# Patient Record
Sex: Male | Born: 1980 | Race: White | Hispanic: No | Marital: Single | Smoking: Current every day smoker
Health system: Southern US, Community
[De-identification: ages and names within clinical notes are randomized; demographics above are authoritative.]

---

## 2004-05-25 ENCOUNTER — Emergency Department (HOSPITAL_COMMUNITY): Admission: EM | Admit: 2004-05-25 | Discharge: 2004-05-25 | Payer: Self-pay | Admitting: Emergency Medicine

## 2009-03-30 ENCOUNTER — Ambulatory Visit: Payer: Self-pay | Admitting: Internal Medicine

## 2009-03-30 ENCOUNTER — Encounter: Payer: Self-pay | Admitting: Internal Medicine

## 2009-03-30 ENCOUNTER — Ambulatory Visit (HOSPITAL_COMMUNITY): Admission: RE | Admit: 2009-03-30 | Discharge: 2009-03-30 | Payer: Self-pay | Admitting: Internal Medicine

## 2009-03-30 DIAGNOSIS — K089 Disorder of teeth and supporting structures, unspecified: Secondary | ICD-10-CM | POA: Insufficient documentation

## 2009-03-30 LAB — CONVERTED CEMR LAB
Basophils Absolute: 0 10*3/uL (ref 0.0–0.1)
CO2: 26 meq/L (ref 19–32)
Chloride: 106 meq/L (ref 96–112)
Eosinophils Absolute: 0.1 10*3/uL (ref 0.0–0.7)
Eosinophils Relative: 3 % (ref 0–5)
Lymphocytes Relative: 30 % (ref 12–46)
Lymphs Abs: 1.2 10*3/uL (ref 0.7–4.0)
Monocytes Absolute: 0.4 10*3/uL (ref 0.1–1.0)
Neutro Abs: 2.2 10*3/uL (ref 1.7–7.7)
Platelets: 166 10*3/uL (ref 150–400)
RDW: 12.4 % (ref 11.5–15.5)
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
## 2012-09-28 ENCOUNTER — Emergency Department (HOSPITAL_COMMUNITY)
Admission: EM | Admit: 2012-09-28 | Discharge: 2012-09-28 | Disposition: A | Discharge status: Home / Self Care | Payer: Self-pay | Attending: Emergency Medicine | Admitting: Emergency Medicine

## 2012-09-28 ENCOUNTER — Encounter (HOSPITAL_COMMUNITY): Payer: Self-pay | Admitting: Emergency Medicine

## 2012-09-28 DIAGNOSIS — F17200 Nicotine dependence, unspecified, uncomplicated: Secondary | ICD-10-CM | POA: Insufficient documentation

## 2012-09-28 DIAGNOSIS — L049 Acute lymphadenitis, unspecified: Secondary | ICD-10-CM | POA: Insufficient documentation

## 2012-09-28 DIAGNOSIS — Z792 Long term (current) use of antibiotics: Secondary | ICD-10-CM | POA: Insufficient documentation

## 2012-09-28 MED ORDER — HYDROCODONE-ACETAMINOPHEN 5-325 MG PO TABS: 2.0000 | ORAL_TABLET | ORAL | Status: DC | PRN

## 2012-09-28 MED ORDER — AMOXICILLIN 500 MG PO CAPS: 500.0000 mg | ORAL_CAPSULE | Freq: Three times a day (TID) | ORAL | Status: DC

## 2012-09-28 MED ORDER — HYDROCODONE-ACETAMINOPHEN 5-325 MG PO TABS: 2.0000 | ORAL_TABLET | Freq: Once | ORAL | Status: DC

## 2012-09-28 NOTE — ED Provider Notes (Signed)
History   This chart was scribed for non-physician practitioner working with Carleene Cooper III, MD by Frederik Pear, ED Scribe. This patient was seen in room [facility] and the patient's care was started at 2143.   CSN: [id number]  Arrival date & time 09/28/12  1927   First MD Initiated Contact with Patient 09/28/12 2143      Chief Complaint  Patient presents with  . Mass    (Consider location/radiation/quality/duration/timing/severity/associated sxs/prior treatment) The history is provided by the patient and medical records.    Garrick Midgley is a 32 y.o. male who presents to the Emergency Department complaining of gradually worsening, constant, moderate painful mass to the left axillary area that is aggravated by moving his arm and improved by nothing that began 1 week ago. He denies any fever, chills, nausea, emesis, night sweats, unexplained weight loss, or diarrhea. He reports that he has no chronic medical conditions that require daily medications. He denies any other rashes or masses.   History reviewed. No pertinent past medical history.  History reviewed. No pertinent past surgical history.  No family history on file.  History  Substance Use Topics  . Smoking status: Current Every Day Smoker  . Smokeless tobacco: Not on file  . Alcohol Use: Yes      Review of Systems  Constitutional: Negative for fever, diaphoresis, appetite change, fatigue and unexpected weight change.  HENT: Negative for mouth sores and neck stiffness.   Eyes: Negative for visual disturbance.  Respiratory: Negative for cough, shortness of breath and wheezing.   Cardiovascular: Negative for chest pain.  Gastrointestinal: Negative for nausea, vomiting, abdominal pain, diarrhea and constipation.  Genitourinary: Negative for dysuria, urgency, frequency and hematuria.  Musculoskeletal: Negative for back pain.  Skin: Negative for rash.       Mass  Neurological: Negative for syncope, light-headedness  and headaches.  Hematological: Does not bruise/bleed easily.  Psychiatric/Behavioral: Negative for sleep disturbance. The patient is not nervous/anxious.   All other systems reviewed and are negative.    Allergies  Review of patient's allergies indicates no known allergies.  Home Medications   Current Outpatient Rx  Name  Route  Sig  Dispense  Refill  . IBUPROFEN 200 MG PO TABS   Oral   Take 600 mg by mouth every 6 (six) hours as needed. For pain         . AMOXICILLIN 500 MG PO CAPS   Oral   Take 1 capsule (500 mg total) by mouth 3 (three) times daily.   21 capsule   0   . HYDROCODONE-ACETAMINOPHEN 5-325 MG PO TABS   Oral   Take 2 tablets by mouth every 4 (four) hours as needed for pain.   10 tablet   0     BP 127/76  Pulse 94  Temp 98.1 F (36.7 C) (Oral)  Resp 18  SpO2 100%  Physical Exam  Nursing note and vitals reviewed. Constitutional: He is oriented to person, place, and time. He appears well-developed and well-nourished. No distress.  HENT:  Head: Normocephalic and atraumatic.  Right Ear: Tympanic membrane, external ear and ear canal normal.  Left Ear: Tympanic membrane, external ear and ear canal normal.  Nose: Nose normal. Right sinus exhibits no maxillary sinus tenderness and no frontal sinus tenderness. Left sinus exhibits no maxillary sinus tenderness and no frontal sinus tenderness.  Mouth/Throat: Uvula is midline and mucous membranes are normal. Mucous membranes are not cyanotic. No oral lesions. No dental abscesses or uvula swelling. No  oropharyngeal exudate, posterior oropharyngeal edema, posterior oropharyngeal erythema or tonsillar abscesses.  Eyes: Conjunctivae normal and EOM are normal. Pupils are equal, round, and reactive to light. No scleral icterus.  Neck: Normal range of motion and full passive range of motion without pain. Neck supple. No spinous process tenderness and no muscular tenderness present. Normal range of motion present.        He has a palpable, non-tender, discrete, and mobile sebaceous cyst on the L neck  Cardiovascular: Normal rate, regular rhythm, normal heart sounds and intact distal pulses.  Exam reveals no gallop and no friction rub.   No murmur heard. Pulmonary/Chest: Effort normal and breath sounds normal. No respiratory distress. He has no wheezes. He has no rales. He exhibits no tenderness.  Abdominal: Soft. Bowel sounds are normal. He exhibits no distension and no mass. There is no tenderness. There is no rebound and no guarding.  Musculoskeletal: Normal range of motion. He exhibits no edema and no tenderness.  Lymphadenopathy:       Head (right side): No submental, no submandibular, no tonsillar, no preauricular, no posterior auricular and no occipital adenopathy present.       Head (left side): No submental, no submandibular, no tonsillar, no preauricular, no posterior auricular and no occipital adenopathy present.    He has no cervical adenopathy.       Right cervical: No superficial cervical, no deep cervical and no posterior cervical adenopathy present.      Left cervical: No superficial cervical, no deep cervical and no posterior cervical adenopathy present.    He has axillary adenopathy.       Right axillary: No pectoral and no lateral adenopathy present.       Left axillary: Pectoral adenopathy present. No lateral adenopathy present.      Right: No supraclavicular adenopathy present.       Left: No supraclavicular adenopathy present.  Neurological: He is alert and oriented to person, place, and time. He exhibits normal muscle tone. Coordination normal.       Speech is clear and goal oriented Moves extremities without ataxia  Skin: Skin is warm and dry. No rash noted. He is not diaphoretic. No erythema.       On the left axilla, he has a palpable, tender, discrete, non-mobile mass. There is no erythema, no fluctuance, no induration, and no evidence of cellulitis.  Psychiatric: He has a normal mood  and affect.    ED Course  Procedures (including critical care time)  DIAGNOSTIC STUDIES: Oxygen Saturation is 100% on room air, normal by my interpretation.    COORDINATION OF CARE:  21:48- Discussed planned course of treatment with the patient, including antibiotics and pain medication, who is agreeable at this time.   Labs Reviewed - No data to display No results found.   1. Lymphadenitis, acute       MDM  Carleene Mains presents with swollen lymph node in the left axilla.  No evidence of abscess or cellulitis.  Concern for infectious etiology versus lymphoma.  We'll treat with antibiotics and pain medications. I have given strict instructions for the patient followup here in the emergency department if it is not improved in one week.  Patient alert, oriented, NAD, nontoxic, nonseptic appearing.  He is without B-cell symptoms.  I have also discussed reasons to return immediately to the ER.  Patient expresses understanding and agrees with plan.  1. Medications: Amoxicillin, Vicodin, usual home medications  2. Treatment: rest, drink plenty of fluids, take  your medications as prescribed  3. Follow Up: Please followup with your primary doctor for discussion of your diagnoses and further evaluation after today's visit; if you do not have a primary care doctor use the resource guide provided to find one; followup back up in the emergency department or with another physician if this does not resolve within one week    I personally performed the services described in this documentation, which was scribed in my presence. The recorded information has been reviewed and is accurate.         Dahlia Client Shadara Lopez, PA-C 09/28/12 2248

## 2012-09-28 NOTE — ED Notes (Signed)
C/o painful lump at L axilla since last week.

## 2012-09-29 NOTE — ED Provider Notes (Signed)
Medical screening examination/treatment/procedure(s) were conducted as a shared visit with non-physician practitioner(s) and myself.  I personally evaluated the patient during the encounter Pt with week long Hx of tender left axillary mass.  Exam shows 1 cm tender mass in left axilla, probably lymphadenitis.  Advised antibiotic Rx.  If not improved in a week, pt to return for referral for biopsy.  Carleene Cooper III, MD 09/29/12 1124

## 2013-02-05 ENCOUNTER — Encounter (HOSPITAL_COMMUNITY): Payer: Self-pay | Admitting: Emergency Medicine

## 2013-02-05 ENCOUNTER — Emergency Department (HOSPITAL_COMMUNITY)
Admission: EM | Admit: 2013-02-05 | Discharge: 2013-02-05 | Disposition: A | Discharge status: Home / Self Care | Payer: Self-pay | Attending: Emergency Medicine | Admitting: Emergency Medicine

## 2013-02-05 ENCOUNTER — Emergency Department (HOSPITAL_COMMUNITY): Payer: Self-pay

## 2013-02-05 DIAGNOSIS — M25519 Pain in unspecified shoulder: Secondary | ICD-10-CM | POA: Insufficient documentation

## 2013-02-05 DIAGNOSIS — M25511 Pain in right shoulder: Secondary | ICD-10-CM

## 2013-02-05 DIAGNOSIS — F17200 Nicotine dependence, unspecified, uncomplicated: Secondary | ICD-10-CM | POA: Insufficient documentation

## 2013-02-05 MED ORDER — IBUPROFEN 800 MG PO TABS: 800.0000 mg | ORAL_TABLET | Freq: Three times a day (TID) | ORAL | Status: DC

## 2013-02-05 MED ORDER — HYDROCODONE-ACETAMINOPHEN 5-325 MG PO TABS: 2.0000 | ORAL_TABLET | Freq: Four times a day (QID) | ORAL | Status: DC | PRN

## 2013-02-05 NOTE — ED Provider Notes (Signed)
History    This chart was scribed for Roxy Horseman PA-C, a non-physician practitioner working with Gwyneth Sprout, MD by Lewanda Rife, ED Scribe. This patient was seen in room WTR5/WTR5 and the patient's care was started at 1620.     CSN: [id number]  Arrival date & time 02/05/13  1445   First MD Initiated Contact with Patient 02/05/13 1612      Chief Complaint  Patient presents with  . Shoulder Pain    (Consider location/radiation/quality/duration/timing/severity/associated sxs/prior treatment) The history is provided by the patient.   HPI Comments: Thomas Martinez is a 32 y.o. male who presents to the Emergency Department complaining of constant severe, non-radiating right shoulder pain onset 4 days. Reports unemployed. Describes pain as "being stuck with a needle in shoulder" and 10/10 in severity. Reports pain is aggravated with movement and alleviated by nothing. Denies fever, any injury, nausea, neck pain, back pain, emesis, diarrhea, constipation, and paresthesias. Denies taking any medications for pain to treat pain. Denies injury of right shoulder in the past.  History reviewed. No pertinent past medical history.  No past surgical history on file.  No family history on file.  History  Substance Use Topics  . Smoking status: Current Every Day Smoker  . Smokeless tobacco: Not on file  . Alcohol Use: Yes      Review of Systems  HENT: Negative for neck pain.   Musculoskeletal: Positive for myalgias (right shoulder).  All other systems reviewed and are negative.   A complete 10 system review of systems was obtained and all systems are negative except as noted in the HPI and PMH.    Allergies  Review of patient's allergies indicates no known allergies.  Home Medications   Current Outpatient Rx  Name  Route  Sig  Dispense  Refill  . ibuprofen (ADVIL,MOTRIN) 200 MG tablet   Oral   Take 600 mg by mouth every 6 (six) hours as needed. For pain            BP 125/87  Pulse 107  Temp(Src) 98.3 F (36.8 C) (Oral)  Resp 16  SpO2 99%  Physical Exam  Nursing note and vitals reviewed. Constitutional: He is oriented to person, place, and time. He appears well-developed and well-nourished. No distress.  HENT:  Head: Normocephalic and atraumatic.  Eyes: Conjunctivae and EOM are normal.  Neck: Neck supple. No tracheal deviation present.  Cardiovascular: Normal rate.   Pulmonary/Chest: Effort normal. No respiratory distress.  Musculoskeletal: He exhibits tenderness (right shoulder ).       Right shoulder: He exhibits decreased range of motion (secondary to pain ), tenderness and pain. He exhibits no deformity.  Strength of right shoulder deferred secondary to pain. Hawkin's-Kennedy impingement test negative. Subscapularis strength deficiency.   Neurological: He is alert and oriented to person, place, and time.  Skin: Skin is warm and dry.  Psychiatric: He has a normal mood and affect. His behavior is normal.    ED Course  Procedures (including critical care time) Medications - No data to display   Labs Reviewed - No data to display No results found.   1. Shoulder pain, acute, right       MDM  Patient with right shoulder pain. I am suspicious of rotator cuff injury. Recommend orthopedic followup. Will treat with ibuprofen and some Vicodin. Patient given a sling.     I personally performed the services described in this documentation, which was scribed in my presence. The recorded information has been reviewed  and is accurate.     Roxy Horseman, PA-C 02/06/13 [phone]

## 2013-02-05 NOTE — Progress Notes (Signed)
[facility] has seen patient. Provided with Primary Care Resources.

## 2013-02-05 NOTE — ED Notes (Signed)
Pt c/o atraumatic shoulder pain x 4 days.  Worse upon movement.  Denies injury.

## 2013-02-07 NOTE — ED Provider Notes (Signed)
Medical screening examination/treatment/procedure(s) were performed by non-physician practitioner and as supervising physician I was immediately available for consultation/collaboration.   Gwyneth Sprout, MD 02/07/13 1504

## 2021-07-16 ENCOUNTER — Emergency Department (HOSPITAL_COMMUNITY): Payer: Self-pay

## 2021-07-16 ENCOUNTER — Other Ambulatory Visit: Payer: Self-pay

## 2021-07-16 ENCOUNTER — Emergency Department (HOSPITAL_COMMUNITY)
Admission: EM | Admit: 2021-07-16 | Discharge: 2021-07-17 | Disposition: A | Discharge status: Home / Self Care | Payer: Self-pay | Attending: Emergency Medicine | Admitting: Emergency Medicine

## 2021-07-16 DIAGNOSIS — M546 Pain in thoracic spine: Secondary | ICD-10-CM | POA: Insufficient documentation

## 2021-07-16 DIAGNOSIS — Z79899 Other long term (current) drug therapy: Secondary | ICD-10-CM | POA: Insufficient documentation

## 2021-07-16 DIAGNOSIS — R000 Tachycardia, unspecified: Secondary | ICD-10-CM | POA: Insufficient documentation

## 2021-07-16 DIAGNOSIS — Z20822 Contact with and (suspected) exposure to covid-19: Secondary | ICD-10-CM | POA: Insufficient documentation

## 2021-07-16 DIAGNOSIS — F17200 Nicotine dependence, unspecified, uncomplicated: Secondary | ICD-10-CM | POA: Insufficient documentation

## 2021-07-16 DIAGNOSIS — R1110 Vomiting, unspecified: Secondary | ICD-10-CM | POA: Insufficient documentation

## 2021-07-16 LAB — CBC WITH DIFFERENTIAL/PLATELET
Abs Immature Granulocytes: 0.11 10*3/uL — ABNORMAL HIGH (ref 0.00–0.07)
Basophils Absolute: 0 10*3/uL (ref 0.0–0.1)
Basophils Relative: 0 %
Eosinophils Absolute: 0 10*3/uL (ref 0.0–0.5)
Eosinophils Relative: 0 %
HCT: 53.4 % — ABNORMAL HIGH (ref 39.0–52.0)
Hemoglobin: 18.7 g/dL — ABNORMAL HIGH (ref 13.0–17.0)
Immature Granulocytes: 1 %
Lymphocytes Relative: 12 %
Lymphs Abs: 1.6 10*3/uL (ref 0.7–4.0)
MCH: 31.8 pg (ref 26.0–34.0)
MCHC: 35 g/dL (ref 30.0–36.0)
MCV: 90.8 fL (ref 80.0–100.0)
Monocytes Absolute: 0.5 10*3/uL (ref 0.1–1.0)
Monocytes Relative: 4 %
Neutro Abs: 10.7 10*3/uL — ABNORMAL HIGH (ref 1.7–7.7)
Neutrophils Relative %: 83 %
Platelets: 333 10*3/uL (ref 150–400)
RBC: 5.88 MIL/uL — ABNORMAL HIGH (ref 4.22–5.81)
RDW: 13 % (ref 11.5–15.5)
WBC: 13 10*3/uL — ABNORMAL HIGH (ref 4.0–10.5)
nRBC: 0 % (ref 0.0–0.2)

## 2021-07-16 LAB — COMPREHENSIVE METABOLIC PANEL
ALT: 26 U/L (ref 0–44)
AST: 22 U/L (ref 15–41)
Albumin: 5.6 g/dL — ABNORMAL HIGH (ref 3.5–5.0)
Alkaline Phosphatase: 75 U/L (ref 38–126)
Anion gap: 17 — ABNORMAL HIGH (ref 5–15)
BUN: 22 mg/dL — ABNORMAL HIGH (ref 6–20)
CO2: 24 mmol/L (ref 22–32)
Calcium: 10.2 mg/dL (ref 8.9–10.3)
Chloride: 98 mmol/L (ref 98–111)
Creatinine, Ser: 1.26 mg/dL — ABNORMAL HIGH (ref 0.61–1.24)
GFR, Estimated: >60 mL/min (ref >60)
Glucose, Bld: 131 mg/dL — ABNORMAL HIGH (ref 70–99)
Potassium: 4.1 mmol/L (ref 3.5–5.1)
Sodium: 139 mmol/L (ref 135–145)
Total Bilirubin: 0.4 mg/dL (ref 0.3–1.2)
Total Protein: 9.6 g/dL — ABNORMAL HIGH (ref 6.5–8.1)

## 2021-07-16 LAB — RESP PANEL BY RT-PCR (FLU A&B, COVID) ARPGX2
Influenza A by PCR: NEGATIVE
Influenza B by PCR: NEGATIVE
SARS Coronavirus 2 by RT PCR: NEGATIVE

## 2021-07-16 LAB — TSH: TSH: 0.593 u[IU]/mL (ref 0.350–4.500)

## 2021-07-16 LAB — LIPASE, BLOOD: Lipase: 26 U/L (ref 11–51)

## 2021-07-16 LAB — T4, FREE: Free T4: 0.92 ng/dL (ref 0.61–1.12)

## 2021-07-16 MED ORDER — ONDANSETRON HCL 4 MG/2ML IJ SOLN
4.0000 mg | Freq: Once | INTRAMUSCULAR | Status: AC
  Administered 2021-07-16: 4 mg via INTRAVENOUS
  Filled 2021-07-16: qty 2

## 2021-07-16 MED ORDER — SODIUM CHLORIDE 0.9 % IV BOLUS
1000.0000 mL | Freq: Once | INTRAVENOUS | Status: AC
  Administered 2021-07-16: 1000 mL via INTRAVENOUS

## 2021-07-16 MED ORDER — LACTATED RINGERS IV BOLUS
1000.0000 mL | Freq: Once | INTRAVENOUS | Status: AC
  Administered 2021-07-16: 1000 mL via INTRAVENOUS

## 2021-07-16 MED ORDER — FENTANYL CITRATE PF 50 MCG/ML IJ SOSY
25.0000 ug | PREFILLED_SYRINGE | Freq: Once | INTRAMUSCULAR | Status: AC
  Administered 2021-07-16: 25 ug via INTRAVENOUS
  Filled 2021-07-16: qty 1

## 2021-07-16 MED ORDER — LORAZEPAM 2 MG/ML IJ SOLN
0.5000 mg | Freq: Once | INTRAMUSCULAR | Status: AC
  Administered 2021-07-16: 0.5 mg via INTRAVENOUS
  Filled 2021-07-16: qty 1

## 2021-07-16 MED ORDER — FENTANYL CITRATE PF 50 MCG/ML IJ SOSY
50.0000 ug | PREFILLED_SYRINGE | Freq: Once | INTRAMUSCULAR | Status: AC
  Administered 2021-07-16: 50 ug via INTRAVENOUS
  Filled 2021-07-16: qty 1

## 2021-07-16 NOTE — ED Triage Notes (Signed)
Pt BIB EMS.  Coming from home.  Back pain and vomiting x 2 days.  Pt does endorse use of fentanyl a couple of hours ago.

## 2021-07-16 NOTE — ED Provider Notes (Signed)
[facility]-EMERGENCY DEPT Provider Note   CSN: [id number] Arrival date & time: 07/16/21  1851     History Chief Complaint  Patient presents with   Emesis    Thomas Martinez is a 40 y.o. male.  HPI     40 year old male with history of polysubstance abuse presents with concern for back pain and vomiting for 2 days.  Has had 5-10 episodes of emesis per day. No hematemesis. No diarrhea or constipation. No significant abdominal pain.No chest pain or dyspnea, does feel upper back pain, No fever, no urinary symptoms.  Upper back pain for days.  Has not stopped any medications recently. Occ etoh, not regular/no etoh withdrawal.  Hx of polysubstance abuse. No hx of IVDU,  reports snorting fentanyl prior to arrival.    History reviewed. No pertinent past medical history.  Patient Active Problem List   Diagnosis Date Noted   DENTAL PAIN 03/30/2009    No past surgical history on file.     No family history on file.  Social History   Tobacco Use   Smoking status: Every Day  Substance Use Topics   Alcohol use: Yes   Drug use: No    Home Medications Prior to Admission medications   Medication Sig Start Date End Date Taking? Authorizing Provider  HYDROcodone-acetaminophen (NORCO/VICODIN) 5-325 MG per tablet Take 2 tablets by mouth every 6 (six) hours as needed for pain. 02/05/13   Roxy Horseman, PA-C  ibuprofen (ADVIL,MOTRIN) 200 MG tablet Take 600 mg by mouth every 6 (six) hours as needed. For pain    [provider]  ibuprofen (ADVIL,MOTRIN) 800 MG tablet Take 1 tablet (800 mg total) by mouth 3 (three) times daily. 02/05/13   Roxy Horseman, PA-C    Allergies    Patient has no known allergies.  Review of Systems   Review of Systems  Constitutional:  Positive for appetite change and fatigue. Negative for fever.  HENT:  Negative for sore throat.   Respiratory:  Negative for cough and shortness of breath.   Cardiovascular:  Negative for  chest pain.  Gastrointestinal:  Positive for nausea and vomiting. Negative for abdominal pain, constipation and diarrhea.  Genitourinary:  Negative for dysuria.  Musculoskeletal:  Positive for back pain.  Skin:  Negative for rash.  Neurological:  Negative for weakness, numbness and headaches.   Physical Exam Updated Vital Signs BP (!) 152/110 (BP Location: Right Arm)   Pulse (!) 115   Temp 98.5 F (36.9 C) (Oral)   Resp 16   Ht 6' (1.829 m)   Wt 68 kg   SpO2 100%   BMI 20.34 kg/m   Physical Exam Vitals and nursing note reviewed.  Constitutional:      General: He is not in acute distress.    Appearance: He is well-developed. He is not diaphoretic.  HENT:     Head: Normocephalic and atraumatic.  Eyes:     Conjunctiva/sclera: Conjunctivae normal.  Cardiovascular:     Rate and Rhythm: Regular rhythm. Tachycardia present.     Heart sounds: Normal heart sounds. No murmur heard.   No friction rub. No gallop.  Pulmonary:     Effort: Pulmonary effort is normal. No respiratory distress.     Breath sounds: Normal breath sounds. No wheezing or rales.  Abdominal:     General: There is no distension.     Palpations: Abdomen is soft.     Tenderness: There is no abdominal tenderness. There is no guarding.  Musculoskeletal:  General: Tenderness (thoracic paraspinal) present.     Cervical back: Normal range of motion.  Skin:    General: Skin is warm and dry.  Neurological:     Mental Status: He is alert and oriented to person, place, and time.    ED Results / Procedures / Treatments   Labs (all labs ordered are listed, but only abnormal results are displayed) Labs Reviewed  CBC WITH DIFFERENTIAL/PLATELET - Abnormal; Notable for the following components:      Result Value   WBC 13.0 (*)    RBC 5.88 (*)    Hemoglobin 18.7 (*)    HCT 53.4 (*)    Neutro Abs 10.7 (*)    Abs Immature Granulocytes 0.11 (*)    All other components within normal limits  COMPREHENSIVE  METABOLIC PANEL - Abnormal; Notable for the following components:   Glucose, Bld 131 (*)    BUN 22 (*)    Creatinine, Ser 1.26 (*)    Total Protein 9.6 (*)    Albumin 5.6 (*)    Anion gap 17 (*)    All other components within normal limits  RESP PANEL BY RT-PCR (FLU A&B, COVID) ARPGX2  CULTURE, BLOOD (ROUTINE X 2)  CULTURE, BLOOD (ROUTINE X 2)  LIPASE, BLOOD  TSH  T4, FREE  RAPID URINE DRUG SCREEN, HOSP PERFORMED  URINALYSIS, ROUTINE W REFLEX MICROSCOPIC  LACTIC ACID, PLASMA  LACTIC ACID, PLASMA    EKG None  Radiology CT Angio Chest PE W and/or Wo Contrast  Result Date: 07/17/2021 CLINICAL DATA:  Chest pain and vomiting for 2 days, recent fentanyl use, initial encounter EXAM: CT ANGIOGRAPHY CHEST WITH CONTRAST TECHNIQUE: Multidetector CT imaging of the chest was performed using the standard protocol during bolus administration of intravenous contrast. Multiplanar CT image reconstructions and MIPs were obtained to evaluate the vascular anatomy. CONTRAST:  5mL OMNIPAQUE IOHEXOL 350 MG/ML SOLN COMPARISON:  Chest x-ray from the previous day. FINDINGS: Cardiovascular: Thoracic aorta and its branches appear within normal limits. No aneurysmal dilatation is seen. No cardiac enlargement is noted. No focal filling defect to suggest pulmonary embolism is seen. No coronary calcifications are noted. Mediastinum/Nodes: Thoracic inlet is within normal limits. No sizable hilar or mediastinal adenopathy is noted. The esophagus as visualized is within normal limits. Lungs/Pleura: Lungs are well aerated bilaterally. No focal infiltrate or sizable effusion is seen. No sizable parenchymal nodule is noted. Upper Abdomen: Visualized upper abdomen appears unremarkable. Musculoskeletal: No rib abnormality is noted. Mild degenerative changes of the thoracic spine are seen. Review of the MIP images confirms the above findings. IMPRESSION: No evidence of pulmonary embolism. No acute abnormality noted. Electronically  Signed   By: Alcide Clever M.D.   On: 07/17/2021 00:24   DG Chest Portable 1 View  Result Date: 07/16/2021 CLINICAL DATA:  Back pain and tachycardia. EXAM: PORTABLE CHEST 1 VIEW COMPARISON:  None. FINDINGS: The heart size and mediastinal contours are within normal limits. Both lungs are clear. The visualized skeletal structures are unremarkable. IMPRESSION: No active disease. Electronically Signed   By: Darliss Cheney M.D.   On: 07/16/2021 20:36    Procedures Procedures   Medications Ordered in ED Medications  sodium chloride 0.9 % bolus 1,000 mL (0 mLs Intravenous Stopped 07/16/21 2256)  ondansetron (ZOFRAN) injection 4 mg (4 mg Intravenous Given 07/16/21 1955)  fentaNYL (SUBLIMAZE) injection 25 mcg (25 mcg Intravenous Given 07/16/21 1956)  lactated ringers bolus 1,000 mL (1,000 mLs Intravenous New Bag/Given 07/16/21 2355)  LORazepam (ATIVAN) injection 0.5  mg (0.5 mg Intravenous Given 07/16/21 2351)  fentaNYL (SUBLIMAZE) injection 50 mcg (50 mcg Intravenous Given 07/16/21 2348)  iohexol (OMNIPAQUE) 350 MG/ML injection 80 mL (80 mLs Intravenous Contrast Given 07/17/21 0019)    ED Course  I have reviewed the triage vital signs and the nursing notes.  Pertinent labs & imaging results that were available during my care of the patient were reviewed by me and considered in my medical decision making (see chart for details).    MDM Rules/Calculators/A&P                            40 year old male with history of polysubstance abuse presents with concern for back pain and vomiting for 2 days.  EKG shows sinus tachycardia with diffuse ST changes.  Abdominal exam benign, no tenderness to suspect acute appendicitis, cholecystitis, diverticulitis, SBO.  Consider viral etiology, gastritis, polysubstance abuse, withdrawal as etiology of emesis. Given IV fluids, zofran.  Improving nausea but tachycardia persistent.  Given back pain, ordered CT PE study for evaluation of persistent tachycardia and  thoracic back pain.  Given additional fluids, ordered blood cx.  UA/lactate pending, reevaluatin and CT pending at time of transfer of care.    Final Clinical Impression(s) / ED Diagnoses Final diagnoses:  Vomiting, unspecified vomiting type, unspecified whether nausea present  Sinus tachycardia    Rx / DC Orders ED Discharge Orders     None        Alvira Monday, MD 07/17/21 [phone]

## 2021-07-17 ENCOUNTER — Emergency Department (HOSPITAL_COMMUNITY): Payer: Self-pay

## 2021-07-17 ENCOUNTER — Encounter (HOSPITAL_COMMUNITY): Payer: Self-pay

## 2021-07-17 LAB — URINALYSIS, ROUTINE W REFLEX MICROSCOPIC
Glucose, UA: NEGATIVE mg/dL
Hgb urine dipstick: NEGATIVE
Ketones, ur: 5 mg/dL — AB
Nitrite: NEGATIVE
Protein, ur: 100 mg/dL — AB
Specific Gravity, Urine: 1.042 — ABNORMAL HIGH (ref 1.005–1.030)
WBC, UA: >50 WBC/hpf — ABNORMAL HIGH (ref 0–5)
pH: 5 (ref 5.0–8.0)

## 2021-07-17 LAB — LACTIC ACID, PLASMA
Lactic Acid, Venous: 2.4 mmol/L (ref 0.5–1.9)
Lactic Acid, Venous: 2 mmol/L (ref 0.5–1.9)

## 2021-07-17 LAB — RAPID URINE DRUG SCREEN, HOSP PERFORMED
Amphetamines: POSITIVE — AB
Barbiturates: NOT DETECTED
Benzodiazepines: POSITIVE — AB
Cocaine: NOT DETECTED
Opiates: POSITIVE — AB
Tetrahydrocannabinol: POSITIVE — AB

## 2021-07-17 MED ORDER — IOHEXOL 350 MG/ML SOLN
80.0000 mL | Freq: Once | INTRAVENOUS | Status: AC | PRN
  Administered 2021-07-17: 80 mL via INTRAVENOUS

## 2021-07-17 MED ORDER — ONDANSETRON 4 MG PO TBDP: ORAL_TABLET | ORAL | 0 refills | Status: AC

## 2021-07-17 NOTE — Discharge Instructions (Addendum)
Follow up with your doctor in the office.  

## 2021-07-17 NOTE — ED Provider Notes (Signed)
I see the patient in signout from Dr. Dalene Seltzer, briefly the patient is a 40 year old male with a chief complaints of back pain.  Patient was found to be tachycardic into the 160s on arrival.  Given 2 L of IV fluids with improvement but not resolution of his tachycardia.  Plan for CT angiogram of the chest and lactate and another liter of fluids and reassessment.  Patient's lactate is mildly elevated.  His heart rate continues to trend downward.  UDS was positive for amphetamines.  This is likely the cause of his tachycardia upon arrival.  We will have him follow-up with his family doctor in the office.   Melene Plan, DO 07/17/21 0214

## 2021-07-17 NOTE — ED Notes (Signed)
Patient removed all of his monitoring equipment as well as his IV prior to being discharged. Patient's remainder of LR bolus was on floor. Patient pulled IV out and bleed all over floor. Patient is being discharged at this time.

## 2021-07-17 NOTE — ED Notes (Addendum)
Patient walked out with his discharged papers without RN reviewing the instructions. RN unable to get last set of vital signs

## 2021-07-22 LAB — CULTURE, BLOOD (ROUTINE X 2)
Culture: NO GROWTH
Culture: NO GROWTH
Special Requests: ADEQUATE
Special Requests: ADEQUATE

## 2021-10-04 ENCOUNTER — Emergency Department (HOSPITAL_COMMUNITY): Payer: Self-pay

## 2021-10-04 ENCOUNTER — Emergency Department (HOSPITAL_COMMUNITY)
Admission: EM | Admit: 2021-10-04 | Discharge: 2021-10-04 | Disposition: A | Discharge status: Home / Self Care | Payer: Self-pay | Attending: Emergency Medicine | Admitting: Emergency Medicine

## 2021-10-04 ENCOUNTER — Encounter (HOSPITAL_COMMUNITY): Payer: Self-pay

## 2021-10-04 DIAGNOSIS — M546 Pain in thoracic spine: Secondary | ICD-10-CM | POA: Insufficient documentation

## 2021-10-04 DIAGNOSIS — R110 Nausea: Secondary | ICD-10-CM

## 2021-10-04 DIAGNOSIS — R1011 Right upper quadrant pain: Secondary | ICD-10-CM | POA: Insufficient documentation

## 2021-10-04 DIAGNOSIS — R112 Nausea with vomiting, unspecified: Secondary | ICD-10-CM | POA: Insufficient documentation

## 2021-10-04 LAB — CBC WITH DIFFERENTIAL/PLATELET
Abs Immature Granulocytes: 0.04 10*3/uL (ref 0.00–0.07)
Basophils Absolute: 0 10*3/uL (ref 0.0–0.1)
Basophils Relative: 0 %
Eosinophils Absolute: 0.1 10*3/uL (ref 0.0–0.5)
Eosinophils Relative: 1 %
HCT: 41.8 % (ref 39.0–52.0)
Hemoglobin: 14.5 g/dL (ref 13.0–17.0)
Immature Granulocytes: 1 %
Lymphocytes Relative: 24 %
Lymphs Abs: 1.9 10*3/uL (ref 0.7–4.0)
MCH: 32 pg (ref 26.0–34.0)
MCHC: 34.7 g/dL (ref 30.0–36.0)
MCV: 92.3 fL (ref 80.0–100.0)
Monocytes Absolute: 0.6 10*3/uL (ref 0.1–1.0)
Monocytes Relative: 7 %
Neutro Abs: 5.3 10*3/uL (ref 1.7–7.7)
Neutrophils Relative %: 67 %
Platelets: 235 10*3/uL (ref 150–400)
RBC: 4.53 MIL/uL (ref 4.22–5.81)
RDW: 11.8 % (ref 11.5–15.5)
WBC: 7.9 10*3/uL (ref 4.0–10.5)
nRBC: 0 % (ref 0.0–0.2)

## 2021-10-04 LAB — COMPREHENSIVE METABOLIC PANEL
ALT: 14 U/L (ref 0–44)
AST: 15 U/L (ref 15–41)
Albumin: 4.4 g/dL (ref 3.5–5.0)
Alkaline Phosphatase: 58 U/L (ref 38–126)
Anion gap: 10 (ref 5–15)
BUN: 20 mg/dL (ref 6–20)
CO2: 24 mmol/L (ref 22–32)
Calcium: 9.4 mg/dL (ref 8.9–10.3)
Chloride: 101 mmol/L (ref 98–111)
Creatinine, Ser: 0.83 mg/dL (ref 0.61–1.24)
GFR, Estimated: >60 mL/min (ref >60)
Glucose, Bld: 90 mg/dL (ref 70–99)
Potassium: 3.2 mmol/L — ABNORMAL LOW (ref 3.5–5.1)
Sodium: 135 mmol/L (ref 135–145)
Total Bilirubin: 1 mg/dL (ref 0.3–1.2)
Total Protein: 7.3 g/dL (ref 6.5–8.1)

## 2021-10-04 LAB — LIPASE, BLOOD: Lipase: 24 U/L (ref 11–51)

## 2021-10-04 MED ORDER — LACTATED RINGERS IV BOLUS
1000.0000 mL | Freq: Once | INTRAVENOUS | Status: AC
  Administered 2021-10-04: 1000 mL via INTRAVENOUS

## 2021-10-04 MED ORDER — ONDANSETRON HCL 4 MG/2ML IJ SOLN
4.0000 mg | Freq: Once | INTRAMUSCULAR | Status: AC
  Administered 2021-10-04: 4 mg via INTRAVENOUS
  Filled 2021-10-04: qty 2

## 2021-10-04 MED ORDER — KETOROLAC TROMETHAMINE 15 MG/ML IJ SOLN
15.0000 mg | Freq: Once | INTRAMUSCULAR | Status: AC
  Administered 2021-10-04: 15 mg via INTRAVENOUS
  Filled 2021-10-04: qty 1

## 2021-10-04 MED ORDER — SODIUM CHLORIDE (PF) 0.9 % IJ SOLN
INTRAMUSCULAR | Status: AC
  Filled 2021-10-04: qty 50

## 2021-10-04 MED ORDER — PROMETHAZINE HCL 25 MG PO TABS: 25.0000 mg | ORAL_TABLET | Freq: Four times a day (QID) | ORAL | 0 refills | Status: AC | PRN

## 2021-10-04 MED ORDER — IOHEXOL 300 MG/ML  SOLN
100.0000 mL | Freq: Once | INTRAMUSCULAR | Status: AC | PRN
  Administered 2021-10-04: 100 mL via INTRAVENOUS

## 2021-10-04 MED ORDER — OXYCODONE-ACETAMINOPHEN 5-325 MG PO TABS
1.0000 | ORAL_TABLET | Freq: Once | ORAL | Status: AC
  Administered 2021-10-04: 1 via ORAL
  Filled 2021-10-04: qty 1

## 2021-10-04 NOTE — ED Triage Notes (Signed)
Pt reports nausea for the past 2 days pt reports decreased appetite. States he has been laying on couch for the past 2 days.

## 2021-10-04 NOTE — ED Provider Notes (Signed)
[facility] Provider Note   CSN: [id number] Arrival date & time: 10/04/21  [id number]     History  Chief Complaint  Patient presents with   Nausea    Carleene MainsRobert Faulds is a 41 y.o. male.  Patient is a 41 year old male with a history of fentanyl use daily who is presenting today with complaints of nausea vomiting and intermittent abdominal pain.  Nausea and vomiting started 2 days ago.  He reported he woke up feeling nauseated and just never went away.  Anytime he tries to eat or drink anything he will vomit.  He has been laying on the couch for the last 2 days and reports he always has back pain but laying on the couch has made it worse.  The pain in his back is in the center of his back in the upper lumbar spine and is worse with movement and pushing on it.  He has not had any diarrhea or urinary symptoms.  He denies any fever, cough, chills, shortness of breath.  He last used fentanyl yesterday which he snorts and reports never injects.  He does not use marijuana currently and has not lute used for the last 1 year.  He denies any prior surgeries on his abdomen.  He denies any medication allergies.  He reports that in the past he came in November and had what he felt was similar symptoms at that time and they gave him Zofran which significantly improved his symptoms and he has been fine until 2 days ago.  The history is provided by the patient.      Home Medications Prior to Admission medications   Medication Sig Start Date End Date Taking? Authorizing Provider  HYDROcodone-acetaminophen (NORCO/VICODIN) 5-325 MG per tablet Take 2 tablets by mouth every 6 (six) hours as needed for pain. 02/05/13   Roxy HorsemanBrowning, Codee, PA-C  ibuprofen (ADVIL,MOTRIN) 200 MG tablet Take 600 mg by mouth every 6 (six) hours as needed. For pain    [provider]  ibuprofen (ADVIL,MOTRIN) 800 MG tablet Take 1 tablet (800 mg total) by mouth 3 (three) times daily. 02/05/13   Roxy HorsemanBrowning,  Brayson, PA-C  ondansetron (ZOFRAN ODT) 4 MG disintegrating tablet 4mg  ODT q4 hours prn nausea/vomit 07/17/21   Melene PlanFloyd, Dan, DO      Allergies    Patient has no known allergies.    Review of Systems   Review of Systems  Physical Exam Updated Vital Signs BP 114/68    Pulse 100    Temp 97.9 F (36.6 C) (Oral)    Resp 18    Ht 6' (1.829 m)    Wt 68 kg    SpO2 100%    BMI 20.34 kg/m  Physical Exam Vitals and nursing note reviewed.  Constitutional:      General: He is not in acute distress.    Appearance: He is well-developed.  HENT:     Head: Normocephalic and atraumatic.     Mouth/Throat:     Mouth: Mucous membranes are dry.     Comments: Poor dentition Eyes:     Conjunctiva/sclera: Conjunctivae normal.     Pupils: Pupils are equal, round, and reactive to light.  Cardiovascular:     Rate and Rhythm: Normal rate and regular rhythm.     Heart sounds: No murmur heard. Pulmonary:     Effort: Pulmonary effort is normal. No respiratory distress.     Breath sounds: Normal breath sounds. No wheezing or rales.  Abdominal:  General: There is no distension.     Palpations: Abdomen is soft.     Tenderness: There is abdominal tenderness in the right upper quadrant. There is no guarding or rebound.  Musculoskeletal:        General: No tenderness. Normal range of motion.     Cervical back: Normal range of motion and neck supple.     Right lower leg: No edema.     Left lower leg: No edema.  Skin:    General: Skin is warm and dry.     Findings: No erythema or rash.  Neurological:     Mental Status: He is alert and oriented to person, place, and time. Mental status is at baseline.  Psychiatric:        Mood and Affect: Mood normal.        Behavior: Behavior normal.    ED Results / Procedures / Treatments   Labs (all labs ordered are listed, but only abnormal results are displayed) Labs Reviewed  COMPREHENSIVE METABOLIC PANEL - Abnormal; Notable for the following components:       Result Value   Potassium 3.2 (*)    All other components within normal limits  CBC WITH DIFFERENTIAL/PLATELET  LIPASE, BLOOD  URINALYSIS, ROUTINE W REFLEX MICROSCOPIC    EKG None  Radiology CT ABDOMEN PELVIS W CONTRAST  Result Date: 10/04/2021 CLINICAL DATA:  Nausea, vomiting and back pain. EXAM: CT ABDOMEN AND PELVIS WITH CONTRAST TECHNIQUE: Multidetector CT imaging of the abdomen and pelvis was performed using the standard protocol following bolus administration of intravenous contrast. RADIATION DOSE REDUCTION: This exam was performed according to the departmental dose-optimization program which includes automated exposure control, adjustment of the mA and/or kV according to patient size and/or use of iterative reconstruction technique. CONTRAST:  OMNIPAQUE IOHEXOL 300 MG/ML  SOLN COMPARISON:  None. FINDINGS: Lower chest: Lung bases are clear.  No pleural or pericardial fluid. Hepatobiliary: Liver parenchyma is normal.  No calcified gallstones. Pancreas: Normal.  No evidence of pancreatitis. Spleen: Normal Adrenals/Urinary Tract: Adrenal glands are normal. Kidneys are normal. Bladder is normal. Stomach/Bowel: No evidence of ileus, obstruction or free air. Some low-density fluid in the stomach and proximal small bowel, presumably secondary to recent consumption. The appendix is normal. The colon is normal. Vascular/Lymphatic: Aortic atherosclerosis, minimal detectable. IVC is normal. No adenopathy. Reproductive: Normal Other: No free fluid or air. Musculoskeletal: Normal IMPRESSION: Negative examination. No abnormality seen to explain the presenting symptoms. It appears that the patient is probably consumes some water just before the scan, but I do not believe that the stomach or proximal small bowel are abnormal. No other abnormality seen to explain the presenting symptoms. Electronically Signed   By: Paulina Fusi M.D.   On: 10/04/2021 10:25    Procedures Procedures    Medications  Ordered in ED Medications  sodium chloride (PF) 0.9 % injection (has no administration in time range)  oxyCODONE-acetaminophen (PERCOCET/ROXICET) 5-325 MG per tablet 1 tablet (has no administration in time range)  lactated ringers bolus 1,000 mL (1,000 mLs Intravenous New Bag/Given 10/04/21 0748)  ondansetron (ZOFRAN) injection 4 mg (4 mg Intravenous Given 10/04/21 0749)  ketorolac (TORADOL) 15 MG/ML injection 15 mg (15 mg Intravenous Given 10/04/21 0812)  iohexol (OMNIPAQUE) 300 MG/ML solution 100 mL (100 mLs Intravenous Contrast Given 10/04/21 0947)    ED Course/ Medical Decision Making/ A&P  Medical Decision Making Amount and/or Complexity of Data Reviewed External Data Reviewed: notes. Labs: ordered. Decision-making details documented in ED Course. Radiology: ordered. Decision-making details documented in ED Course.  Risk Prescription drug management.   Patient is a 41 year old male presenting today with nausea vomiting x2 days and some moderate right upper quadrant abdominal pain.  Patient has no prior surgical history.  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  Low suspicion for withdrawal as patient is still currently using.  Concern for possible hepatitis versus gallbladder pathology.  Lower suspicion for renal stones, COVID or acute respiratory or cardiac etiology.  Patient does appear to have some dehydration on exam.  He will be given IV fluids, antiemetic, labs to evaluate for the above are pending.  May need imaging based on lab and repeat evaluation.  10:48 AM I independently evaluated patient's labs and his CAT scan images.  The BC, CMP, lipase are all within normal limits.  Patient's CAT scan today I did not see any significant bleeding or obstruction.  Radiology reported no acute findings.  On repeat evaluation patient is tolerating p.o.'s.  He is starting to go through some opiate withdrawal but otherwise does not have any acute  findings.  Heart rate is in the 80s, blood pressure is normal.  Will discharge patient home with antiemetics.  The findings were discussed with the patient and his questions were answered.  Considered admission but he has no criteria to meet admission at this time.  He has no social barriers affecting his discharge today.        Final Clinical Impression(s) / ED Diagnoses Final diagnoses:  Nausea without vomiting    Rx / DC Orders ED Discharge Orders          Ordered    promethazine (PHENERGAN) 25 MG tablet  Every 6 hours PRN        10/04/21 1050              Gwyneth Sprout, MD 10/04/21 1050

## 2021-10-04 NOTE — Discharge Instructions (Signed)
So your blood work and your CAT scan look okay today.  You were given a prescription for nausea medication to use as needed.  If you start running a high fever, vomiting becomes worse return to the emergency room.

## 2022-08-14 IMAGING — CT CT ABD-PELV W/ CM
2 of 5 series · 16 of 46 positions shown, 18 images · IV contrast (agent unspecified)
Comparison: None.

CLINICAL DATA: Nausea, vomiting and back pain.

EXAM:
CT ABDOMEN AND PELVIS WITH CONTRAST
TECHNIQUE: Multidetector CT imaging of the abdomen and pelvis was performed
using the standard protocol following bolus administration of
intravenous contrast.

[Series 2: axial st · axial · 0.83mm/px · z∈[-684,-259]mm · 13 of 99 slices shown, 15 images]
[im 7/99  soft-tissue]
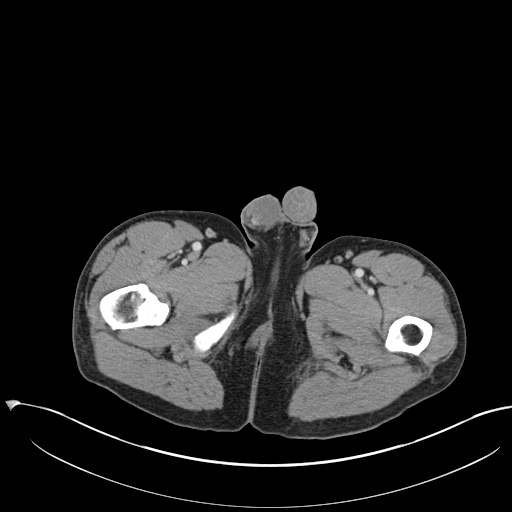
[im 7/99  bone]
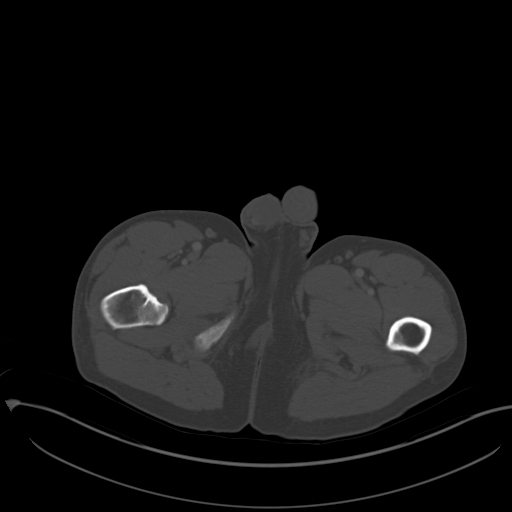
[im 14/99  soft-tissue]
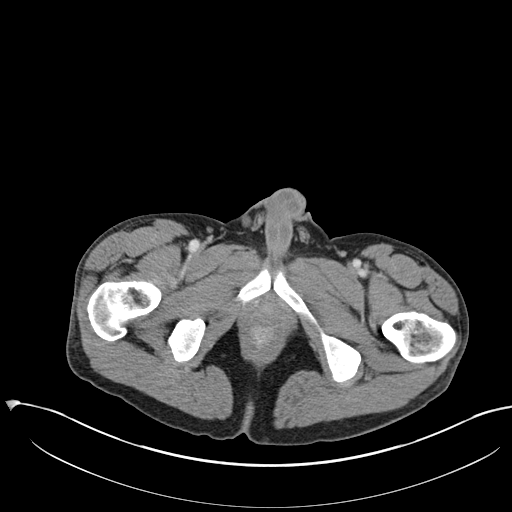
[im 20/99  soft-tissue]
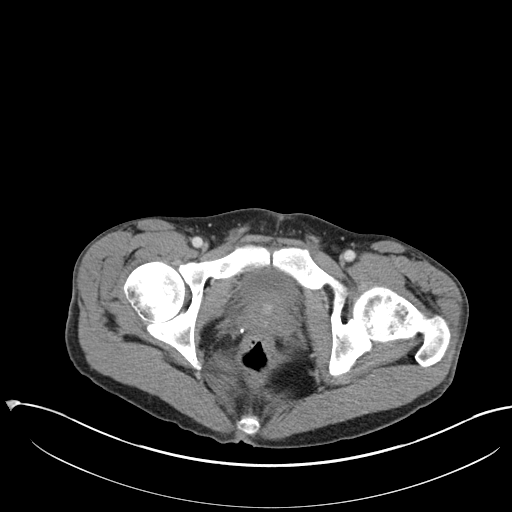
[im 27/99  soft-tissue]
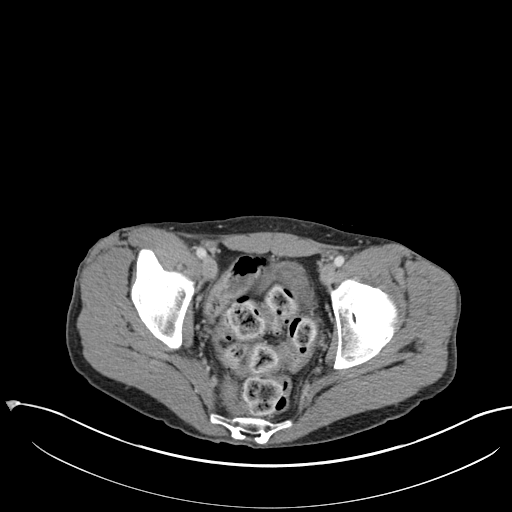
[im 33/99  soft-tissue]
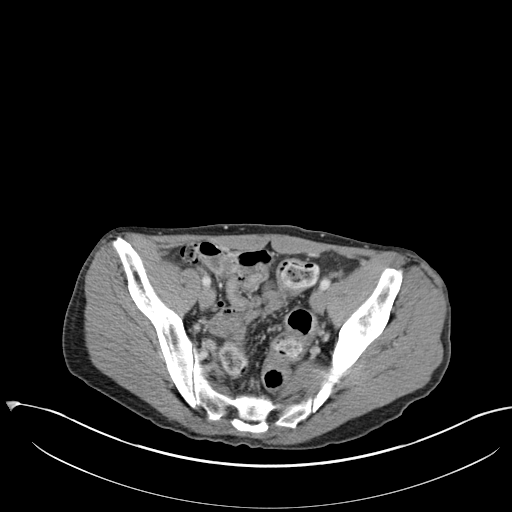
[im 40/99  soft-tissue]
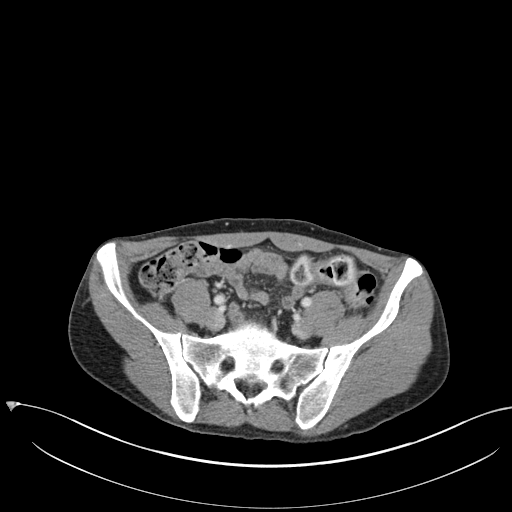
[im 53/99  soft-tissue]
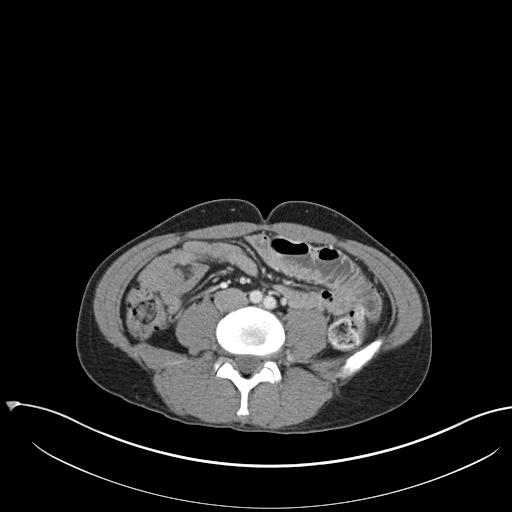
[im 59/99  soft-tissue]
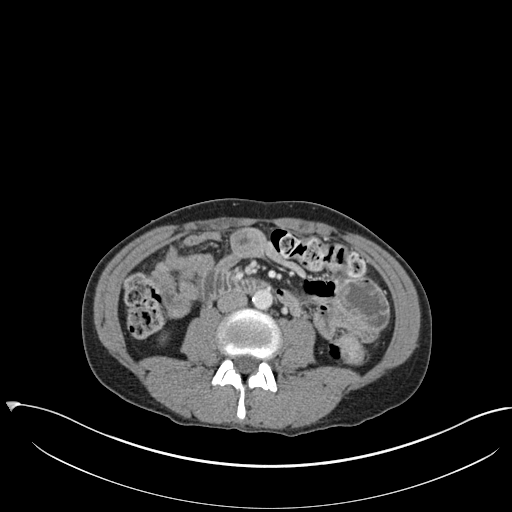
[im 66/99  soft-tissue]
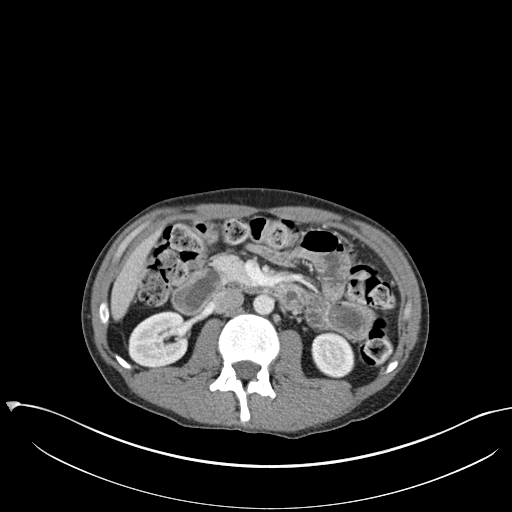
[im 66/99  bone]
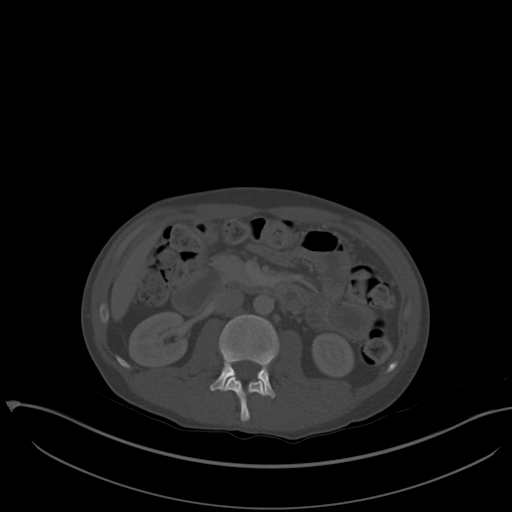
[im 72/99  soft-tissue]
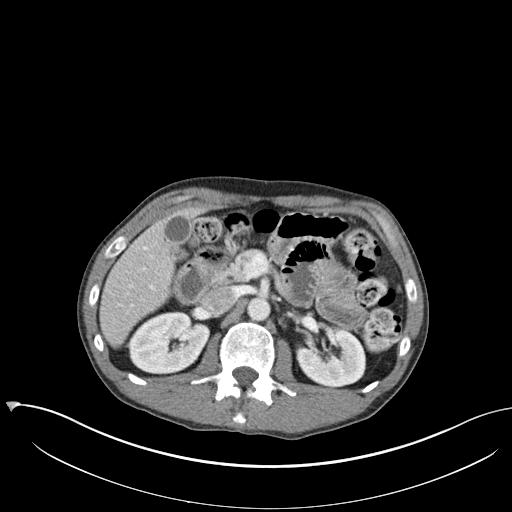
[im 79/99  soft-tissue]
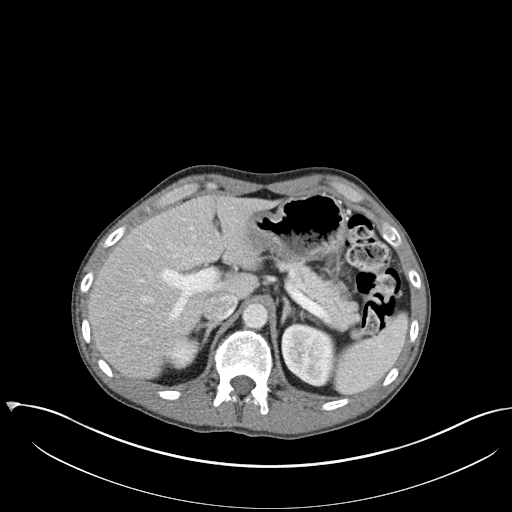
[im 85/99  soft-tissue]
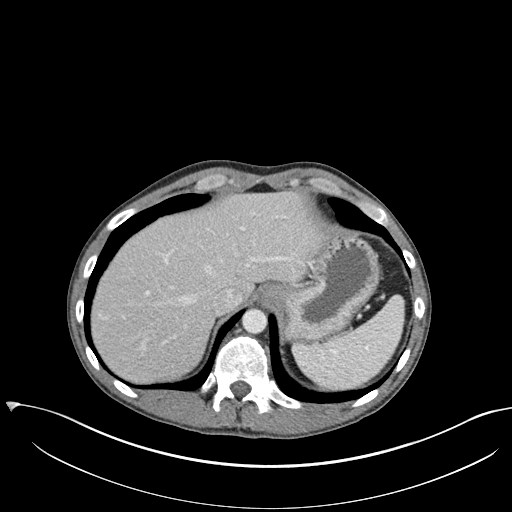
[im 92/99  soft-tissue]
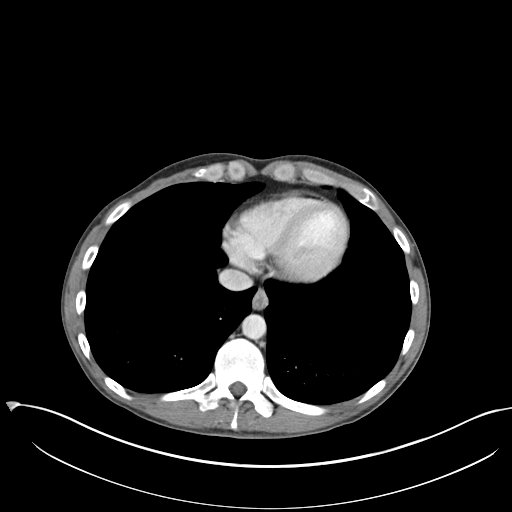

[Series 5: coronal st · coronal · 0.65mm/px · 3 of 128 slices shown]
[im 43/128  soft-tissue]
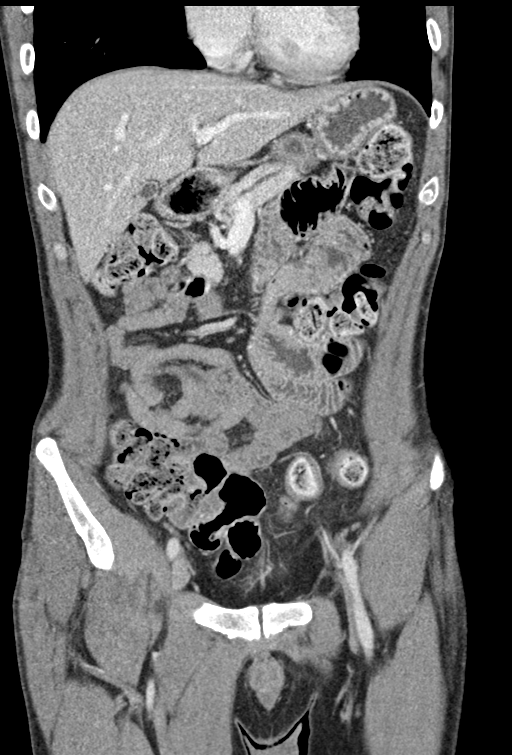
[im 57/128  soft-tissue]
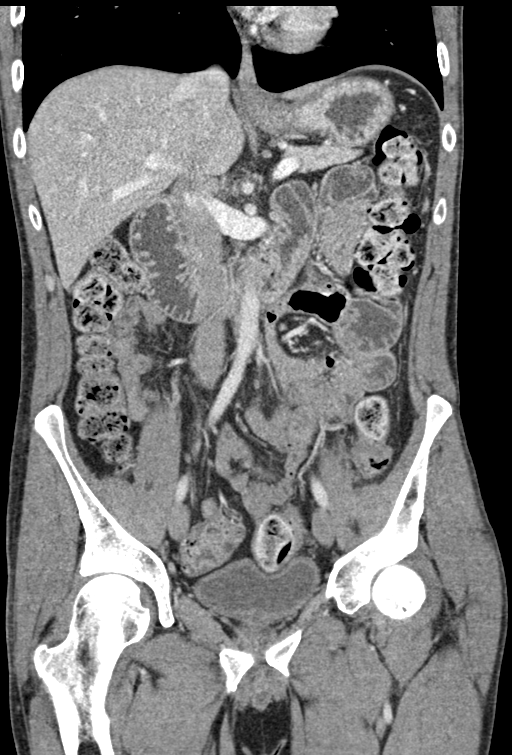
[im 71/128  soft-tissue]
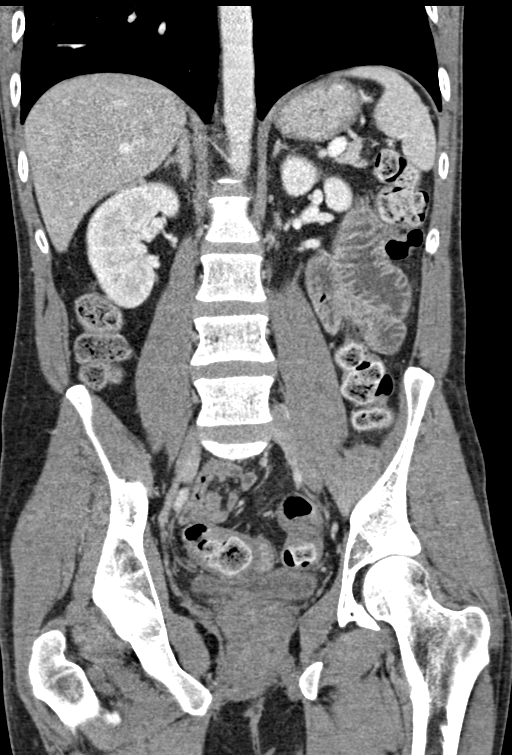

[16 of 46 positions shown; findings below may reference images not displayed]

RADIATION DOSE REDUCTION: This exam was performed according to the
departmental dose-optimization program which includes automated
exposure control, adjustment of the mA and/or kV according to
patient size and/or use of iterative reconstruction technique.

CONTRAST:  100mL OMNIPAQUE IOHEXOL 300 MG/ML  SOLN
FINDINGS: Lower chest: Lung bases are clear.  No pleural or pericardial fluid.

Hepatobiliary: Liver parenchyma is normal.  No calcified gallstones.

Pancreas: Normal.  No evidence of pancreatitis.

Spleen: Normal

Adrenals/Urinary Tract: Adrenal glands are normal. Kidneys are
normal. Bladder is normal.

Stomach/Bowel: No evidence of ileus, obstruction or free air. Some
low-density fluid in the stomach and proximal small bowel,
presumably secondary to recent consumption. The appendix is normal.
The colon is normal.

Vascular/Lymphatic: Aortic atherosclerosis, minimal detectable. IVC
is normal. No adenopathy.

Reproductive: Normal

Other: No free fluid or air.

Musculoskeletal: Normal
IMPRESSION: Negative examination. No abnormality seen to explain the presenting
symptoms. It appears that the patient is probably consumes some
water just before the scan, but I do not believe that the stomach or
proximal small bowel are abnormal. No other abnormality seen to
explain the presenting symptoms.

## 2023-02-01 ENCOUNTER — Emergency Department (HOSPITAL_COMMUNITY)
Admission: EM | Admit: 2023-02-01 | Discharge: 2023-02-01 | Disposition: A | Discharge status: Home / Self Care | Payer: Self-pay | Attending: Emergency Medicine | Admitting: Emergency Medicine

## 2023-02-01 ENCOUNTER — Encounter (HOSPITAL_COMMUNITY): Payer: Self-pay

## 2023-02-01 ENCOUNTER — Other Ambulatory Visit: Payer: Self-pay

## 2023-02-01 ENCOUNTER — Emergency Department (HOSPITAL_COMMUNITY): Payer: Self-pay

## 2023-02-01 DIAGNOSIS — S62327A Displaced fracture of shaft of fifth metacarpal bone, left hand, initial encounter for closed fracture: Secondary | ICD-10-CM | POA: Insufficient documentation

## 2023-02-01 DIAGNOSIS — W2209XA Striking against other stationary object, initial encounter: Secondary | ICD-10-CM | POA: Insufficient documentation

## 2023-02-01 DIAGNOSIS — S62307A Unspecified fracture of fifth metacarpal bone, left hand, initial encounter for closed fracture: Secondary | ICD-10-CM

## 2023-02-01 MED ORDER — IBUPROFEN 800 MG PO TABS
800.0000 mg | ORAL_TABLET | Freq: Once | ORAL | Status: AC
  Administered 2023-02-01: 800 mg via ORAL
  Filled 2023-02-01: qty 1

## 2023-02-01 MED ORDER — HYDROCODONE-ACETAMINOPHEN 5-325 MG PO TABS: 1.0000 | ORAL_TABLET | Freq: Four times a day (QID) | ORAL | 0 refills | Status: AC | PRN

## 2023-02-01 NOTE — Discharge Instructions (Addendum)
You have been evaluated for your hand injury.  You unfortunately have broken a bone in your left hand.  Please call and follow-up closely with hand specialist for outpatient management.  Take medication prescribed as needed for pain control but be aware that it can cause drowsiness.

## 2023-02-01 NOTE — ED Provider Notes (Signed)
[place] EMERGENCY DEPARTMENT AT [facility] Provider Note   CSN: [id number] Arrival date & time: 02/01/23  1554     History  Chief Complaint  Patient presents with   Hand Pain    Thomas Martinez is a 42 y.o. male.  The history is provided by the patient and medical records. No language interpreter was used.  Hand Pain     41-year male presenting for evaluation of left hand injury.  Patient report last night he was upset and punched a window.  He denies any other injury.  He is complaining of pain to his left nondominant hand.  Pain is sharp throbbing moderate in severity not improved with application of ice pack.  He denies any associate numbness no wrist pain no elbow pain no other injury.  No other specific treatment tried.  Home Medications Prior to Admission medications   Medication Sig Start Date End Date Taking? Authorizing Provider  HYDROcodone-acetaminophen (NORCO/VICODIN) 5-325 MG per tablet Take 2 tablets by mouth every 6 (six) hours as needed for pain. 02/05/13   Roxy Horseman, PA-C  ibuprofen (ADVIL,MOTRIN) 200 MG tablet Take 600 mg by mouth every 6 (six) hours as needed. For pain    [provider]  ibuprofen (ADVIL,MOTRIN) 800 MG tablet Take 1 tablet (800 mg total) by mouth 3 (three) times daily. 02/05/13   Roxy Horseman, PA-C  ondansetron (ZOFRAN ODT) 4 MG disintegrating tablet 4mg  ODT q4 hours prn nausea/vomit 07/17/21   Melene Plan, DO  promethazine (PHENERGAN) 25 MG tablet Take 1 tablet (25 mg total) by mouth every 6 (six) hours as needed for nausea or vomiting. 10/04/21   Gwyneth Sprout, MD      Allergies    Patient has no known allergies.    Review of Systems   Review of Systems  All other systems reviewed and are negative.   Physical Exam Updated Vital Signs BP (!) 138/93 (BP Location: Left Arm)   Pulse (!) 106   Temp 98.9 F (37.2 C)   Resp 16   Ht 6' (1.829 m)   Wt 63.5 kg   SpO2 97%   BMI 18.99 kg/m  Physical  Exam Vitals and nursing note reviewed.  Constitutional:      General: He is not in acute distress.    Appearance: He is well-developed.  HENT:     Head: Atraumatic.  Eyes:     Conjunctiva/sclera: Conjunctivae normal.  Musculoskeletal:        General: Signs of injury (Left hand: Moderate edema and tenderness noted to fifth metacarpal region without any skin break.  Decreased finger range of motion secondary to pain but no deformity noted.  Brisk cap refill to all fingers.  Left wrist nontender radial pulse 2+.) present.     Cervical back: Neck supple.  Skin:    Findings: No rash.  Neurological:     Mental Status: He is alert.     ED Results / Procedures / Treatments   Labs (all labs ordered are listed, but only abnormal results are displayed) Labs Reviewed - No data to display  EKG None  Radiology DG Hand Complete Left  Result Date: 02/01/2023 CLINICAL DATA:  Status post trauma. EXAM: LEFT HAND - COMPLETE 3+ VIEW COMPARISON:  None Available. FINDINGS: An acute, mildly displaced fracture deformity is seen involving the distal aspect of the fifth left metacarpal. There is no evidence of dislocation. Mild soft tissue swelling is seen adjacent to the previously noted fracture site. There is  no evidence of arthropathy or other focal bone abnormality. IMPRESSION: Acute fracture of the fifth left metacarpal. Electronically Signed   By: Aram Candela M.D.   On: 02/01/2023 17:24    Procedures Procedures    Medications Ordered in ED Medications - No data to display  ED Course/ Medical Decision Making/ A&P                             Medical Decision Making Amount and/or Complexity of Data Reviewed Radiology: ordered.   BP (!) 138/93 (BP Location: Left Arm)   Pulse (!) 106   Temp 98.9 F (37.2 C)   Resp 16   Ht 6' (1.829 m)   Wt 63.5 kg   SpO2 97%   BMI 18.99 kg/m   5:55 PM  41-year male presenting for evaluation of left hand injury.  Patient report last night he  was upset and punched a window.  He denies any other injury.  He is complaining of pain to his left nondominant hand.  Pain is sharp throbbing moderate in severity not improved with application of ice pack.  He denies any associate numbness no wrist pain no elbow pain no other injury.  No other specific treatment tried.  On exam, left hand is notable for moderate edema and tenderness noted to fifth metacarpal region.  This is a closed injury no skin break.  He does have decreased range of motion about his fingers likely secondary to pain but no obvious deformity noted to the fingers.  Left wrist with full range of motion and radial pulses 2+.  X-ray of the left hand obtained independently viewed and interpreted by me and I agree with radiologist and potation.  X-ray demonstrate acute fracture of the fifth left metacarpal.  I have ordered an ulnar gutter splint to be placed.  Patient given pain medication, will discharge home with opiate pain medication as well as referral to hand specialist to follow-up outpatient.  Social determinant health includes tobacco use.  Patient was given ibuprofen for pain with improvement of his symptoms.        Final Clinical Impression(s) / ED Diagnoses Final diagnoses:  Closed displaced fracture of fifth metacarpal bone of left hand, initial encounter    Rx / DC Orders ED Discharge Orders          Ordered    HYDROcodone-acetaminophen (NORCO/VICODIN) 5-325 MG tablet  Every 6 hours PRN        02/01/23 1819              Fayrene Helper, PA-C 02/01/23 1820    Glyn Ade, MD 02/02/23 1455

## 2023-02-01 NOTE — ED Triage Notes (Signed)
Pt to er, pt states that he is here for  L hand swelling, states that last night he punched a window, pt has swelling to his L hand.

## 2023-02-01 NOTE — Progress Notes (Signed)
Orthopedic Tech Progress Note Patient Details:  Thomas Martinez [birth year] [id number]  Ortho Devices Type of Ortho Device: Ulna gutter splint Ortho Device/Splint Location: LUE Ortho Device/Splint Interventions: Application   Post Interventions Patient Tolerated: Well Instructions Provided: Care of device  [name] E Anzel Kearse 02/01/2023, 6:06 PM
